# Patient Record
Sex: Female | Born: 1940 | Race: White | Hispanic: No | Marital: Married | Smoking: Former smoker
Health system: Southern US, Community
[De-identification: ages and names within clinical notes are randomized; demographics above are authoritative.]

## PROBLEM LIST (undated history)

## (undated) DIAGNOSIS — K219 Gastro-esophageal reflux disease without esophagitis: Secondary | ICD-10-CM

## (undated) DIAGNOSIS — F32A Depression, unspecified: Secondary | ICD-10-CM

## (undated) DIAGNOSIS — I2510 Atherosclerotic heart disease of native coronary artery without angina pectoris: Secondary | ICD-10-CM

## (undated) DIAGNOSIS — I509 Heart failure, unspecified: Secondary | ICD-10-CM

## (undated) DIAGNOSIS — I10 Essential (primary) hypertension: Secondary | ICD-10-CM

## (undated) DIAGNOSIS — F419 Anxiety disorder, unspecified: Secondary | ICD-10-CM

## (undated) DIAGNOSIS — J449 Chronic obstructive pulmonary disease, unspecified: Secondary | ICD-10-CM

## (undated) DIAGNOSIS — F329 Major depressive disorder, single episode, unspecified: Secondary | ICD-10-CM

## (undated) DIAGNOSIS — M1990 Unspecified osteoarthritis, unspecified site: Secondary | ICD-10-CM

## (undated) HISTORY — PX: APPENDECTOMY: SHX54

## (undated) HISTORY — PX: CHOLECYSTECTOMY: SHX55

---

## 2010-05-16 DIAGNOSIS — R079 Chest pain, unspecified: Secondary | ICD-10-CM

## 2015-09-09 ENCOUNTER — Inpatient Hospital Stay (HOSPITAL_COMMUNITY)
Admission: AD | Admit: 2015-09-09 | Discharge: 2015-09-11 | DRG: 287 | Disposition: A | Discharge status: Home / Self Care | Payer: Medicare HMO | Source: Other Acute Inpatient Hospital | Attending: Cardiovascular Disease | Admitting: Cardiovascular Disease

## 2015-09-09 ENCOUNTER — Encounter (HOSPITAL_COMMUNITY): Payer: Self-pay | Admitting: Student

## 2015-09-09 DIAGNOSIS — Z955 Presence of coronary angioplasty implant and graft: Secondary | ICD-10-CM | POA: Diagnosis not present

## 2015-09-09 DIAGNOSIS — Z8249 Family history of ischemic heart disease and other diseases of the circulatory system: Secondary | ICD-10-CM

## 2015-09-09 DIAGNOSIS — Z791 Long term (current) use of non-steroidal anti-inflammatories (NSAID): Secondary | ICD-10-CM | POA: Diagnosis not present

## 2015-09-09 DIAGNOSIS — I2510 Atherosclerotic heart disease of native coronary artery without angina pectoris: Secondary | ICD-10-CM | POA: Diagnosis present

## 2015-09-09 DIAGNOSIS — Z7951 Long term (current) use of inhaled steroids: Secondary | ICD-10-CM | POA: Diagnosis not present

## 2015-09-09 DIAGNOSIS — I25110 Atherosclerotic heart disease of native coronary artery with unstable angina pectoris: Secondary | ICD-10-CM | POA: Diagnosis not present

## 2015-09-09 DIAGNOSIS — Z8673 Personal history of transient ischemic attack (TIA), and cerebral infarction without residual deficits: Secondary | ICD-10-CM | POA: Diagnosis not present

## 2015-09-09 DIAGNOSIS — J810 Acute pulmonary edema: Secondary | ICD-10-CM

## 2015-09-09 DIAGNOSIS — R062 Wheezing: Secondary | ICD-10-CM

## 2015-09-09 DIAGNOSIS — I214 Non-ST elevation (NSTEMI) myocardial infarction: Secondary | ICD-10-CM | POA: Diagnosis not present

## 2015-09-09 DIAGNOSIS — I110 Hypertensive heart disease with heart failure: Principal | ICD-10-CM | POA: Diagnosis present

## 2015-09-09 DIAGNOSIS — J441 Chronic obstructive pulmonary disease with (acute) exacerbation: Secondary | ICD-10-CM

## 2015-09-09 DIAGNOSIS — R0902 Hypoxemia: Secondary | ICD-10-CM | POA: Diagnosis present

## 2015-09-09 DIAGNOSIS — R0789 Other chest pain: Secondary | ICD-10-CM | POA: Diagnosis present

## 2015-09-09 DIAGNOSIS — Z7902 Long term (current) use of antithrombotics/antiplatelets: Secondary | ICD-10-CM

## 2015-09-09 DIAGNOSIS — Z7982 Long term (current) use of aspirin: Secondary | ICD-10-CM | POA: Diagnosis not present

## 2015-09-09 DIAGNOSIS — I509 Heart failure, unspecified: Secondary | ICD-10-CM | POA: Diagnosis not present

## 2015-09-09 DIAGNOSIS — Z9049 Acquired absence of other specified parts of digestive tract: Secondary | ICD-10-CM | POA: Diagnosis not present

## 2015-09-09 DIAGNOSIS — I5033 Acute on chronic diastolic (congestive) heart failure: Secondary | ICD-10-CM | POA: Diagnosis present

## 2015-09-09 DIAGNOSIS — F419 Anxiety disorder, unspecified: Secondary | ICD-10-CM | POA: Diagnosis present

## 2015-09-09 DIAGNOSIS — I10 Essential (primary) hypertension: Secondary | ICD-10-CM | POA: Diagnosis not present

## 2015-09-09 DIAGNOSIS — K219 Gastro-esophageal reflux disease without esophagitis: Secondary | ICD-10-CM | POA: Diagnosis present

## 2015-09-09 DIAGNOSIS — Z87891 Personal history of nicotine dependence: Secondary | ICD-10-CM

## 2015-09-09 DIAGNOSIS — Z880 Allergy status to penicillin: Secondary | ICD-10-CM

## 2015-09-09 DIAGNOSIS — F329 Major depressive disorder, single episode, unspecified: Secondary | ICD-10-CM | POA: Diagnosis present

## 2015-09-09 DIAGNOSIS — R0600 Dyspnea, unspecified: Secondary | ICD-10-CM

## 2015-09-09 HISTORY — DX: Heart failure, unspecified: I50.9

## 2015-09-09 HISTORY — DX: Unspecified osteoarthritis, unspecified site: M19.90

## 2015-09-09 HISTORY — DX: Major depressive disorder, single episode, unspecified: F32.9

## 2015-09-09 HISTORY — DX: Anxiety disorder, unspecified: F41.9

## 2015-09-09 HISTORY — DX: Gastro-esophageal reflux disease without esophagitis: K21.9

## 2015-09-09 HISTORY — DX: Essential (primary) hypertension: I10

## 2015-09-09 HISTORY — DX: Depression, unspecified: F32.A

## 2015-09-09 HISTORY — DX: Atherosclerotic heart disease of native coronary artery without angina pectoris: I25.10

## 2015-09-09 HISTORY — DX: Chronic obstructive pulmonary disease, unspecified: J44.9

## 2015-09-09 LAB — CBC WITH DIFFERENTIAL/PLATELET
BASOS ABS: 0 10*3/uL (ref 0.0–0.1)
Basophils Relative: 0 %
EOS ABS: 0 10*3/uL (ref 0.0–0.7)
EOS PCT: 0 %
HCT: 35.7 % — ABNORMAL LOW (ref 36.0–46.0)
HEMOGLOBIN: 11.1 g/dL — AB (ref 12.0–15.0)
LYMPHS ABS: 1.3 10*3/uL (ref 0.7–4.0)
LYMPHS PCT: 6 %
MCH: 26.1 pg (ref 26.0–34.0)
MCHC: 31.1 g/dL (ref 30.0–36.0)
MCV: 84 fL (ref 78.0–100.0)
Monocytes Absolute: 0.7 10*3/uL (ref 0.1–1.0)
Monocytes Relative: 3 %
NEUTROS PCT: 91 %
Neutro Abs: 18.2 10*3/uL — ABNORMAL HIGH (ref 1.7–7.7)
PLATELETS: 270 10*3/uL (ref 150–400)
RBC: 4.25 MIL/uL (ref 3.87–5.11)
RDW: 14.6 % (ref 11.5–15.5)
WBC: 20.2 10*3/uL — AB (ref 4.0–10.5)

## 2015-09-09 LAB — PROTIME-INR
INR: 1.08
PROTHROMBIN TIME: 14 s (ref 11.4–15.2)

## 2015-09-09 LAB — TROPONIN I
Troponin I: 0.28 ng/mL (ref <0.03)
Troponin I: 0.32 ng/mL (ref <0.03)

## 2015-09-09 LAB — APTT: APTT: 88 s — AB (ref 24–36)

## 2015-09-09 LAB — BASIC METABOLIC PANEL
Anion gap: 12 (ref 5–15)
BUN: 38 mg/dL — AB (ref 6–20)
CHLORIDE: 102 mmol/L (ref 101–111)
CO2: 22 mmol/L (ref 22–32)
CREATININE: 1.14 mg/dL — AB (ref 0.44–1.00)
Calcium: 8.8 mg/dL — ABNORMAL LOW (ref 8.9–10.3)
GFR, EST AFRICAN AMERICAN: 54 mL/min — AB (ref >60)
GFR, EST NON AFRICAN AMERICAN: 46 mL/min — AB (ref >60)
Glucose, Bld: 163 mg/dL — ABNORMAL HIGH (ref 65–99)
POTASSIUM: 4.8 mmol/L (ref 3.5–5.1)
SODIUM: 136 mmol/L (ref 135–145)

## 2015-09-09 LAB — BRAIN NATRIURETIC PEPTIDE: B NATRIURETIC PEPTIDE 5: 414 pg/mL — AB (ref 0.0–100.0)

## 2015-09-09 MED ORDER — METOPROLOL TARTRATE 50 MG PO TABS
50.0000 mg | ORAL_TABLET | Freq: Two times a day (BID) | ORAL | Status: DC
  Administered 2015-09-09: 50 mg via ORAL
  Filled 2015-09-09 (×4): qty 1

## 2015-09-09 MED ORDER — ASPIRIN 81 MG PO CHEW
81.0000 mg | CHEWABLE_TABLET | Freq: Every day | ORAL | Status: DC | PRN
  Administered 2015-09-11: 81 mg via ORAL
  Filled 2015-09-09: qty 1

## 2015-09-09 MED ORDER — ONDANSETRON HCL 4 MG/2ML IJ SOLN: 4.0000 mg | Freq: Four times a day (QID) | INTRAMUSCULAR | Status: DC | PRN

## 2015-09-09 MED ORDER — SODIUM CHLORIDE 0.9% FLUSH: 3.0000 mL | INTRAVENOUS | Status: DC | PRN

## 2015-09-09 MED ORDER — HEPARIN (PORCINE) IN NACL 100-0.45 UNIT/ML-% IJ SOLN
750.0000 [IU]/h | INTRAMUSCULAR | Status: DC
  Administered 2015-09-09: 750 [IU]/h via INTRAVENOUS

## 2015-09-09 MED ORDER — LISINOPRIL 40 MG PO TABS
40.0000 mg | ORAL_TABLET | Freq: Every day | ORAL | Status: DC
  Administered 2015-09-10 – 2015-09-11 (×2): 40 mg via ORAL
  Filled 2015-09-09 (×2): qty 1

## 2015-09-09 MED ORDER — GABAPENTIN 400 MG PO CAPS
800.0000 mg | ORAL_CAPSULE | Freq: Three times a day (TID) | ORAL | Status: DC
  Administered 2015-09-09 – 2015-09-11 (×6): 800 mg via ORAL
  Filled 2015-09-09 (×7): qty 2

## 2015-09-09 MED ORDER — HEPARIN BOLUS VIA INFUSION
3000.0000 [IU] | Freq: Once | INTRAVENOUS | Status: AC
  Administered 2015-09-09: 3000 [IU] via INTRAVENOUS
  Filled 2015-09-09: qty 3000

## 2015-09-09 MED ORDER — FUROSEMIDE 10 MG/ML IJ SOLN: 40.0000 mg | Freq: Two times a day (BID) | INTRAMUSCULAR | Status: DC

## 2015-09-09 MED ORDER — SODIUM CHLORIDE 0.9% FLUSH
3.0000 mL | Freq: Two times a day (BID) | INTRAVENOUS | Status: DC
  Administered 2015-09-09 – 2015-09-11 (×5): 3 mL via INTRAVENOUS

## 2015-09-09 MED ORDER — PANTOPRAZOLE SODIUM 40 MG PO TBEC
40.0000 mg | DELAYED_RELEASE_TABLET | Freq: Every day | ORAL | Status: DC
  Administered 2015-09-10 – 2015-09-11 (×2): 40 mg via ORAL
  Filled 2015-09-09 (×2): qty 1

## 2015-09-09 MED ORDER — ENOXAPARIN SODIUM 40 MG/0.4ML ~~LOC~~ SOLN: 40.0000 mg | SUBCUTANEOUS | Status: DC

## 2015-09-09 MED ORDER — CLOPIDOGREL BISULFATE 75 MG PO TABS
75.0000 mg | ORAL_TABLET | Freq: Every day | ORAL | Status: DC
  Administered 2015-09-09 – 2015-09-11 (×3): 75 mg via ORAL
  Filled 2015-09-09 (×4): qty 1

## 2015-09-09 MED ORDER — NITROGLYCERIN 0.4 MG SL SUBL
0.4000 mg | SUBLINGUAL_TABLET | SUBLINGUAL | Status: DC | PRN
  Administered 2015-09-09 – 2015-09-10 (×3): 0.4 mg via SUBLINGUAL
  Filled 2015-09-09 (×2): qty 1

## 2015-09-09 MED ORDER — ACETAMINOPHEN 325 MG PO TABS: 650.0000 mg | ORAL_TABLET | ORAL | Status: DC | PRN

## 2015-09-09 MED ORDER — MIRTAZAPINE 15 MG PO TABS
15.0000 mg | ORAL_TABLET | Freq: Every day | ORAL | Status: DC
  Administered 2015-09-09 – 2015-09-10 (×2): 15 mg via ORAL
  Filled 2015-09-09 (×2): qty 1

## 2015-09-09 MED ORDER — DOXYCYCLINE HYCLATE 100 MG PO TABS
100.0000 mg | ORAL_TABLET | Freq: Two times a day (BID) | ORAL | Status: AC
  Administered 2015-09-09 – 2015-09-10 (×3): 100 mg via ORAL
  Filled 2015-09-09 (×3): qty 1

## 2015-09-09 MED ORDER — SODIUM CHLORIDE 0.9 % IV SOLN: 250.0000 mL | INTRAVENOUS | Status: DC | PRN

## 2015-09-09 MED ORDER — PREDNISONE 20 MG PO TABS
40.0000 mg | ORAL_TABLET | Freq: Every day | ORAL | Status: AC
  Filled 2015-09-09: qty 2

## 2015-09-09 MED ORDER — NITROGLYCERIN 0.4 MG SL SUBL
SUBLINGUAL_TABLET | SUBLINGUAL | Status: AC
  Filled 2015-09-09: qty 1

## 2015-09-09 MED ORDER — CLONIDINE HCL 0.2 MG PO TABS
0.3000 mg | ORAL_TABLET | Freq: Three times a day (TID) | ORAL | Status: DC
  Administered 2015-09-09 – 2015-09-10 (×3): 0.3 mg via ORAL
  Filled 2015-09-09 (×6): qty 1

## 2015-09-09 MED ORDER — SERTRALINE HCL 100 MG PO TABS
100.0000 mg | ORAL_TABLET | Freq: Every day | ORAL | Status: DC
  Administered 2015-09-10 – 2015-09-11 (×2): 100 mg via ORAL
  Filled 2015-09-09 (×2): qty 1

## 2015-09-09 MED ORDER — ALBUTEROL SULFATE (2.5 MG/3ML) 0.083% IN NEBU
2.5000 mg | INHALATION_SOLUTION | RESPIRATORY_TRACT | Status: DC | PRN
Start: 2015-09-09 — End: 2015-09-11

## 2015-09-09 MED ORDER — FUROSEMIDE 10 MG/ML IJ SOLN
40.0000 mg | Freq: Two times a day (BID) | INTRAMUSCULAR | Status: DC
  Administered 2015-09-09 – 2015-09-11 (×4): 40 mg via INTRAVENOUS
  Filled 2015-09-09 (×5): qty 4

## 2015-09-09 MED ORDER — IPRATROPIUM-ALBUTEROL 0.5-2.5 (3) MG/3ML IN SOLN
3.0000 mL | Freq: Four times a day (QID) | RESPIRATORY_TRACT | Status: DC
  Administered 2015-09-09 – 2015-09-10 (×4): 3 mL via RESPIRATORY_TRACT
  Filled 2015-09-09 (×6): qty 3

## 2015-09-09 NOTE — Progress Notes (Signed)
ANTICOAGULATION CONSULT NOTE - Initial Consult  Pharmacy Consult for Heparin Indication: chest pain/ACS  Allergies  Allergen Reactions  . Penicillins Hives and Swelling    Patient Measurements: Height: 5\' 1"  (154.9 cm) Weight: 156 lb 12 oz (71.1 kg) IBW/kg (Calculated) : 47.8 Heparin Dosing Weight: 63.2kg  Vital Signs: Temp: 97.9 F (36.6 C) (09/07 1317) Temp Source: Oral (09/07 1317) BP: 152/63 (09/07 1655) Pulse Rate: 66 (09/07 1655)  Labs:  Recent Labs  09/09/15 1558  HGB 11.1*  HCT 35.7*  PLT 270  CREATININE 1.14*  TROPONINI 0.28*    Estimated Creatinine Clearance: 39 mL/min (by C-G formula based on SCr of 1.14 mg/dL).   Medical History: Past Medical History:  Diagnosis Date  . Anxiety   . Arthritis   . CAD (coronary artery disease)    a. s/p DES in 2005  . CHF (congestive heart failure) (HCC)   . COPD (chronic obstructive pulmonary disease) (HCC)   . Depression   . GERD (gastroesophageal reflux disease)   . HTN (hypertension)     Medications:  See EMR  Assessment: [age] YOF with COPD exacerbation transferring [facility] for further cardiac work-up. To start heparin for r/o ACS. No PTA anti-coag. Plan for cath once COPD exacerbation and volume overload better managed. Baseline aPTT/INR pending. Hgb 11.1, plt 270.  Goal of Therapy:  Heparin level 0.3-0.7 units/ml Monitor platelets by anticoagulation protocol: Yes   Plan:  Heparin 3000 unit bolus followed by 750 units/hr 8hr HL Monitor CBC, s/sx bleeding   Sherle Poeob Vincent, PharmD Clinical Pharmacist 6:20 PM, 09/09/2015

## 2015-09-09 NOTE — Consult Note (Signed)
Name: Patricia Hardin MRN: [id number] DOB: [birth year]    ADMISSION DATE:  09/09/2015 CONSULTATION DATE:  9/7  REFERRING MD :  Dr. Clifton James  CHIEF COMPLAINT:  Dyspnea  HISTORY OF PRESENT ILLNESS:  75 year old female with PMH as below, which is significant for COPD (on scheduled albuterol only), CAD (s/p DES to RCA in 2005), HTN, and CVA. She was initially admitted to [facility] 9/3 for chest pain which has occurred daily for several years. Also presented with fevers, chills, and dyspnea. Chest pain was atypical in presentation and she was treated as COPD exacerbation with scheduled duonebs, steroids, and doxycycline. She was also hypertensive on presentation with SBP 250s treated with labetalol. BNP elevated on admission which proved to be even worse 7/6 and she was given 20mg  lasix. Stress test on 9/5 demonstrated ST changes so she was transferred to [facility] for potential left heart catheterization. Upon arrival to [facility] she remains on 2L O2, PCCM consulted for evaluation of dyspnea.   At baseline she is markedly limited by dyspnea at home. She states she can barely walk across a small room without feeling short of breath. She attributes this to lung disease. She has a chronic cough and occasionally bring up greyish sputum. This frequency has not increased recently.  SIGNIFICANT EVENTS  9/3 admit for CP, dyspnea to [facility] 9/7 Transfer to [facility] for potential LHC.   STUDIES:  9/5 stress test 2-3 mm ST depression on  9/6 echo with global LV hypokinesis  PAST MEDICAL HISTORY :   has a past medical history of Anxiety; Arthritis; CAD (coronary artery disease); CHF (congestive heart failure) (HCC); COPD (chronic obstructive pulmonary disease) (HCC); Depression; GERD (gastroesophageal reflux disease); and HTN (hypertension).  has a past surgical history that includes Appendectomy and Cholecystectomy. Prior to Admission medications   Medication Sig Start Date End Date Taking?  Authorizing Provider  albuterol (PROVENTIL HFA;VENTOLIN HFA) 108 (90 Base) MCG/ACT inhaler Inhale 1 puff into the lungs every 4 (four) hours as needed for wheezing or shortness of breath.   Yes Historical Provider, MD  aspirin 81 MG chewable tablet Chew 81 mg by mouth daily as needed for mild pain.   Yes Historical Provider, MD  cloNIDine (CATAPRES) 0.3 MG tablet Take 0.3 mg by mouth 3 (three) times daily.   Yes Historical Provider, MD  clopidogrel (PLAVIX) 75 MG tablet Take 75 mg by mouth daily. 06/16/15  Yes Historical Provider, MD  diltiazem (CARDIZEM) 90 MG tablet Take 90 mg by mouth daily.   Yes Historical Provider, MD  furosemide (LASIX) 40 MG tablet Take 40 mg by mouth daily.   Yes Historical Provider, MD  gabapentin (NEURONTIN) 800 MG tablet Take 800 mg by mouth 3 (three) times daily. 07/22/15  Yes Historical Provider, MD  Ibuprofen-Diphenhydramine Cit (IBUPROFEN PM) 200-38 MG TABS Take 1 tablet by mouth at bedtime.   Yes Historical Provider, MD  lisinopril (PRINIVIL,ZESTRIL) 40 MG tablet Take 40 mg by mouth daily.   Yes Historical Provider, MD  metoprolol (LOPRESSOR) 100 MG tablet Take 100 mg by mouth 2 (two) times daily. 07/22/15  Yes Historical Provider, MD  mirtazapine (REMERON) 15 MG tablet Take 15 mg by mouth at bedtime. 07/22/15  Yes Historical Provider, MD  omeprazole (PRILOSEC) 20 MG capsule Take 20 mg by mouth daily. 07/22/15  Yes Historical Provider, MD  sertraline (ZOLOFT) 100 MG tablet Take 100 mg by mouth daily. 06/16/15  Yes Historical Provider, MD   Allergies  Allergen Reactions  .  Penicillins Hives and Swelling    FAMILY HISTORY:  family history includes Hypertension in her mother. SOCIAL HISTORY:  reports that she has quit smoking. She has a 40.00 pack-year smoking history. She has never used smokeless tobacco. She reports that she does not drink alcohol or use drugs.  REVIEW OF SYSTEMS:   Bolds are positive  Constitutional: weight loss, gain, night sweats, Fevers,  chills, fatigue .  HEENT: headaches, Sore throat, sneezing, nasal congestion, post nasal drip, Difficulty swallowing, Tooth/dental problems, visual complaints visual changes, ear ache CV:  chest pain, radiates: ,Orthopnea, PND, swelling in lower extremities, dizziness, palpitations, syncope.  GI  heartburn, indigestion, abdominal pain, nausea, vomiting, diarrhea, change in bowel habits, loss of appetite, bloody stools.  Resp: cough, productive: , hemoptysis, dyspnea on exhertion, chest pain, pleuritic.  Skin: rash or itching or icterus GU: dysuria, change in color of urine, urgency or frequency. flank pain, hematuria  MS: joint pain or swelling. decreased range of motion  Psych: change in mood or affect. depression or anxiety.  Neuro: difficulty with speech, weakness, numbness, ataxia    SUBJECTIVE:   VITAL SIGNS: Temp:  [97.9 F (36.6 C)] 97.9 F (36.6 C) (09/07 1317) Pulse Rate:  [69] 69 (09/07 1317) Resp:  [20] 20 (09/07 1317) BP: (139)/(58) 139/58 (09/07 1317) SpO2:  [93 %] 93 % (09/07 1317)  PHYSICAL EXAMINATION: General:  Chronically ill appearing female in NAD Neuro:  Alert, oriented, non-focal HEENT: [place]/AT, PERRL Cardiovascular:  RRR, no MRG, trace pre-tibial edema.  Lungs:  Posterior rhonchi, upper airway wheeze that resolves with pursed lip breathing. Abdomen:  Soft, non-tender, non-distended Musculoskeletal:  No acute deformity or ROM limitation Skin:  Grossly intact  No results for input(s): NA, K, CL, CO2, BUN, CREATININE, GLUCOSE in the last 168 hours. No results for input(s): HGB, HCT, WBC, PLT in the last 168 hours. No results found.  ASSESSMENT / PLAN:  Dyspnea/Hypoxia:  Patricia Hardin has known diagnosis of COPD for which she only takes albuterol scheduled TID. Symptoms on presentation sound infectious in nature and she has been on antibiotics (doxy) since that time for suspected COPD exacerbation. I suspect that if she was exacerbating her COPD, this is now much  improved, if not resolved. Hard to determine how far off of her baseline current hypoxia truly is. Suspect congestive heart failure playing at least somewhat of a role based on [facility].   Plan: Await CXR Continue prednisone at 40mg  for 5 days total and discontinue Continue doxycycline for total 5 days Scheduled duonebs, which she will likely need on discharge PRN albuterol nebulized Diuresis per cardiology She will need to follow up with pulmonology as an outpatient for PFTs and optimization of her COPD regimen.   Joneen RoachPaul Hoffman, AGACNP-BC [facility] Pager [phone] or [phone]  09/09/2015 4:50 PM  Attending Note:  75 year old female with history of COPD, 45 pack year smoker, quit 15 years ago who is not O2 dependent at home but uses albuterol inhaler 3 times per day as instructed.  She is presenting with no increase in sputum production, no fever/chills, grey sputum with no green.  On exam, wheezes disappear with pursed lip breathing.  I reviewed myself from [facility] with hyperinflation but no distinct consolidation.  Discussed with PCCM-NP.  Patient does not have an acute exacerbation of COPD by gold criteria.  VCD is more likely and hypoxemia is more CHF.  COPD:                       -  Duonebs.                       - Steroids with stop date after 5 days.                       - Doxy with stop date after 5 days.                       - PRN albuterol.                       - Need PFT and f/u with PCCM upon discharge  Hypoxemia:                       - Titrate O2 for sat of 88-92%.                       - Will need an ambulatory desaturation study prior to discharge as I anticipate will need home O2.  Pulmonary edema:                       - Lasix.                       - Treatment of CHF per cardiology.  Wheezing: VCD related rather than true wheezing.                       - Taught patient pursed lip breathing.                        - May benefit from cardiopulmonary rehab upon discharge to teach patient how to breath properly to improve her quality of life.  Patient seen and examined, agree with above note.  I dictated the care and orders written for this patient under my direction.  Alyson Reedy, MD [phone]

## 2015-09-09 NOTE — Progress Notes (Signed)
Pt arrived to [facility] via [facility] from [facility]. Upon arrival transport states her IV blew and they stuck her x2 with no avail. Pt currently stating back and chest pain 10/10. EKG completed. Nitro x1 given. MD paged.

## 2015-09-09 NOTE — H&P (Signed)
History & Physical    Patient ID: Patricia Hardin MRN: [id number], DOB/AGE: [birth year]   Admit date: 09/09/2015  Primary Physician: No primary care provider on file. Primary Cardiologist: Dr. Margret [place]ter [place]  History of Present Illness    Patricia DikeHelen Ruth Llerenas is a 75 y.o. female with past medical history of CAD (s/p DES to RCA in 2005), COPD, HTN, and CVA who presented to [facility] on 09/05/2015 for evaluation of chest pain and was transferred to [facility] on 09/09/2015 for further evaluation.  In talking with the patient, she reports having chest pain on an almost daily basis for years. On the day of her initial presentation she reported having fevers, chills, worsening dyspnea, and pain along her left pectoral region.   She reports having chronic chest pain for many years. Says it can occur with rest or exertion and lasts up to 30 minutes prior to resolving. She had been given SL NTG [facility] with no improvement in her symptoms. Her chest is very tender to palpation along her entire precordium.   In reviewing records from [facility], her BP upon arrival on 09/05/2015 was 253/122 and she was started on Labetaolol. Initial labs showed a negative d-dimer, negative troponin, BNP 1600, and WBC of [id number]. CXR showed no active cardiopulmonary disease.   Repeat labs on 9/7 show a WBC of 19.6, Hgb 10.1, and platelets 294. A repeat troponin was 0.03 and BNP was 4656. It appears she was given one dose of Solumedrol on admission and started on Prednisone 40mg  daily. Received 1 dose of IV Lasix 20mg  on 9/6. Has been receiving scheduled Duonebs Q4H and Doxycycline 100mg  BID.   A stress dobutamine was obtained on 09/07/2015 shows positive EKG changes with 2-603mm horizontal ST depression in the inferolateral leads. Echocardiographic results showed evidence of mild global hypokinesis of the left ventricle. Cardiac catheterization was recommended for definitive evaluation.   Past Medical  History    Past Medical History:  Diagnosis Date  . CAD (coronary artery disease)    a. s/p DES in 2005  . COPD (chronic obstructive pulmonary disease) (HCC)   . HTN (hypertension)     Past Surgical History:  Procedure Laterality Date  . APPENDECTOMY    . CHOLECYSTECTOMY       Allergies  Allergies  Allergen Reactions  . Penicillins Hives and Swelling     Home Medications    Prior to Admission medications   Not on File    Family History    Family History  Problem Relation Age of Onset  . Hypertension Mother     Social History    Social History   Social History  . Marital status: Married    Spouse name: N/A  . Number of children: N/A  . Years of education: N/A   Occupational History  . Not on file.   Social History Main Topics  . Smoking status: Former Smoker    Packs/day: 1.00    Years: 40.00  . Smokeless tobacco: Not on file  . Alcohol use No  . Drug use: No  . Sexual activity: Not on file   Other Topics Concern  . Not on file   Social History Narrative  . No narrative on file     Review of Systems    General:  No night sweats or weight changes. Positive for fevers, chills, and generalized body aches. Cardiovascular:  No edema, orthopnea, palpitations, paroxysmal nocturnal dyspnea. Positive for chest pain and  dyspnea with exertion.  Dermatological: No rash, lesions/masses Respiratory: Positive for cough and dyspnea. Urologic: No hematuria, dysuria Abdominal:   No nausea, vomiting, diarrhea, bright red blood per rectum, melena, or hematemesis Neurologic:  No visual changes, wkns, changes in mental status. All other systems reviewed and are otherwise negative except as noted above.  Physical Exam    Blood pressure (!) 139/58, pulse 69, temperature 97.9 F (36.6 C), temperature source Oral, resp. rate 20, SpO2 93 %.  General: Pleasant elderly Caucasian female appearing in no acute distress. Head: Normocephalic, atraumatic, sclera  non-icteric, no xanthomas, nares are without discharge. Dentition:  Neck: No carotid bruits. JVD at 9cm.  Lungs: Respirations regular and unlabored, rhonchi throughout lungs fields, expiratory wheezing noted. Heart: Regular rate and rhythm. No S3 or S4.  No murmur, no rubs, or gallops appreciated. Abdomen: Soft, non-tender, non-distended with normoactive bowel sounds. No hepatomegaly. No rebound/guarding. No obvious abdominal masses. Msk:  Strength and tone appear normal for age. No joint deformities or effusions. Extremities: No clubbing or cyanosis. No edema.  Distal pedal pulses are 2+ bilaterally. Neuro: Alert and oriented X 3. Moves all extremities spontaneously. No focal deficits noted. Psych:  Responds to questions appropriately with a normal affect. Skin: No rashes or lesions noted  Labs    9/3 (Obtained from OSH): Negative d-dimer, negative troponin, BNP 1600, and WBC of 16109. CXR showed no active cardiopulmonary disease.   9/7 (Obtained from OSH): WBC of 19.6, Hgb 10.1, and platelets 294. A repeat troponin was 0.03 and BNP was 4656.   Radiology Studies    None on File.   EKG & Cardiac Imaging    EKG: NSR, HR 68 with no acute ST or T-wave changes.  STRESS ECHOCARDIOGRAM: 09/07/2015 (Obtained from OSH) Positive EKG changes with 2-39mm horizontal ST depression in the inferolateral leads. Echocardiographic results showed evidence of mild global hypokinesis of the left ventricle. Cardiac catheterization was recommended for definitive evaluation.    Assessment & Plan    1. Worsening Dyspnea/Acute on Chronic Diastolic CHF - presented initially with fever, chills, dyspnea, and worsening chest discomfort. CXR negative. Had been started on Abx and steroids at [facility] for likely COPD exacerbation. Will continue current medications for now and consult Pulmonology for further recommendations.  - BNP was 1600 on 9/3, up to 4656 on 9/7. Will recheck BNP and CXR.  - will start on  IV Lasix 40mg  daily. Monitor I&O's with daily weights. May require Foley as she reports issues with incontinence.  - will obtain echocardiogram to assess LV function and wall motion.    2. COPD Exacerbation - has known COPD, not on home O2 but currently on 2L [place]. - will continue previously started Doxycycline 100mg  BID and Prednisone 40mg  daily. Scheduled Duonebs. Will obtain repeat CXR. WBC elevated to [address], possibly secondary to steroid use. - will consult Pulmonology for assistance. Appreciate recommendations  3. Atypical Chest Pain - patient reports having chest discomfort for the past several years, lasting for up to 30 minutes. No relief with SL NTG. - stress dobutamine on 09/07/2015 from OSH showed positive EKG changes with 2-59mm horizontal ST depression in the inferolateral leads. Echocardiographic results showed evidence of mild global hypokinesis of the left ventricle. Cardiac catheterization was recommended for definitive evaluation. - at this time, her respiratory status is not optical for a cardiac catheterization. Her symptoms appear very atypical as it is reproducible on palpation. - troponin negative at OSH. Will cycle values here. If they becomes positive, start  Heparin.    4. CAD - s/p DES to RCA in 2005. - continue ASA, Plavix and BB. Will check Lipid Panel for risk stratification as she is not currently on statin therapy.  5. HTN - SBP currently in the 130's. Was hypertensive up to 253/122 on arrival to [facility]. By review of records it is unclear exactly what she was taking on a daily basis while there. - will continue Lisinopril 40mg  daily, Lopressor 50mg  BID (was on 100mg  BID- may need to titrate up if BP becomes elevated. Will do lower dose for now while receiving IV diuresis), and Clonidine 0.3mg  TID.    Signed, Ellsworth Lennox, PA-C 09/09/2015, 2:18 PM Pager: [phone]  I have personally seen and examined this patient with Randall An, PA-C. I  agree with the assessment and plan as outlined above. She was admitted to [facility] 4 days ago with fever, chills, dyspnea and was diagnosed with COPD exacerbation. She has chest wall pain that is reproducible to touch. Because of this troponin was checked and it was subtly elevated at 0.04. Dobutamine stress echo with ischemic EKG changes and possible ischemia with worsened global wall motion after dobutamine infusion. She is known to have CAD with prior PCI/stent in 2005. She has been treated for COPD exacerbation with steroids, nebs and antibiotics with no clear clinical improvement. She has been given one dose of IV Lasix yesterday for volume overload but is still volume overloaded. Exam shows a well developed female in NAD. Neck: +JVD CV:RRR without murmur. Lungs: diffuse rhonci with expiratory wheezing. Abd: soft, NT Ext: No edema. Labs reviewed.  Will continue treatment for COPD exacerbation and volume overload before considering cardiac cath. Her chest pain is reproducible and her troponin shows no significant elevation. I suspect her symptoms are primarily driven by her acute pulmonary process. Given the abnormal stress echo, she will need cardiac cath before discharge. We will obtain an echo, chest x-ray, labs. Will ask pulmonary team to assist with management of COPD. Will begin diuresis for volume overload.   Verne Carrow 09/09/2015 3:39 PM

## 2015-09-09 NOTE — Progress Notes (Deleted)
Attending Note:  75 year old female with history of COPD, 45 pack year smoker, quit 15 years ago who is not O2 dependent at home but uses albuterol inhaler 3 times per day as instructed.  She is presenting with no increase in sputum production, no fever/chills, grey sputum with no green.  On exam, wheezes disappear with pursed lip breathing.  I reviewed myself from [facility] with hyperinflation but no distinct consolidation.  Discussed with PCCM-NP.  Patient does not have an acute exacerbation of COPD by gold criteria.  VCD is more likely and hypoxemia is more CHF.  COPD:  - Duonebs.  - Steroids with stop date after 5 days.  - Doxy with stop date after 5 days.  - PRN albuterol.  - Need PFT and f/u with PCCM upon discharge  Hypoxemia:  - Titrate O2 for sat of 88-92%.  - Will need an ambulatory desaturation study prior to discharge as I anticipate will need home O2.  Pulmonary edema:  - Lasix.  - Treatment of CHF per cardiology.  Wheezing: VCD related rather than true wheezing.  - Taught patient pursed lip breathing.  - May benefit from cardiopulmonary rehab upon discharge to teach patient how to breath properly to improve her quality of life.  Patient seen and examined, agree with above note.  I dictated the care and orders written for this patient under my direction.  Alyson ReedyWesam G Yacoub, MD [phone]

## 2015-09-10 ENCOUNTER — Inpatient Hospital Stay (HOSPITAL_COMMUNITY): Payer: Medicare HMO

## 2015-09-10 ENCOUNTER — Other Ambulatory Visit (HOSPITAL_COMMUNITY): Payer: Medicare Other

## 2015-09-10 ENCOUNTER — Encounter (HOSPITAL_COMMUNITY)
Admission: AD | Disposition: A | Discharge status: Home / Self Care | Payer: Self-pay | Source: Other Acute Inpatient Hospital | Attending: Cardiovascular Disease

## 2015-09-10 DIAGNOSIS — I2510 Atherosclerotic heart disease of native coronary artery without angina pectoris: Secondary | ICD-10-CM

## 2015-09-10 DIAGNOSIS — I214 Non-ST elevation (NSTEMI) myocardial infarction: Secondary | ICD-10-CM

## 2015-09-10 DIAGNOSIS — I10 Essential (primary) hypertension: Secondary | ICD-10-CM

## 2015-09-10 DIAGNOSIS — R0600 Dyspnea, unspecified: Secondary | ICD-10-CM

## 2015-09-10 DIAGNOSIS — I509 Heart failure, unspecified: Secondary | ICD-10-CM

## 2015-09-10 DIAGNOSIS — I25110 Atherosclerotic heart disease of native coronary artery with unstable angina pectoris: Secondary | ICD-10-CM

## 2015-09-10 HISTORY — PX: CARDIAC CATHETERIZATION: SHX172

## 2015-09-10 LAB — CBC
HCT: 34.1 % — ABNORMAL LOW (ref 36.0–46.0)
Hemoglobin: 10.2 g/dL — ABNORMAL LOW (ref 12.0–15.0)
MCH: 25.2 pg — ABNORMAL LOW (ref 26.0–34.0)
MCHC: 29.9 g/dL — AB (ref 30.0–36.0)
MCV: 84.2 fL (ref 78.0–100.0)
PLATELETS: 265 10*3/uL (ref 150–400)
RBC: 4.05 MIL/uL (ref 3.87–5.11)
RDW: 14.5 % (ref 11.5–15.5)
WBC: 18 10*3/uL — ABNORMAL HIGH (ref 4.0–10.5)

## 2015-09-10 LAB — LIPID PANEL
CHOL/HDL RATIO: 2.8 ratio
CHOLESTEROL: 176 mg/dL (ref 0–200)
HDL: 62 mg/dL (ref >40)
LDL Cholesterol: 77 mg/dL (ref 0–99)
TRIGLYCERIDES: 187 mg/dL — AB (ref <150)
VLDL: 37 mg/dL (ref 0–40)

## 2015-09-10 LAB — HEPARIN LEVEL (UNFRACTIONATED)
HEPARIN UNFRACTIONATED: 0.34 [IU]/mL (ref 0.30–0.70)
HEPARIN UNFRACTIONATED: 0.29 [IU]/mL — AB (ref 0.30–0.70)

## 2015-09-10 LAB — BASIC METABOLIC PANEL
ANION GAP: 10 (ref 5–15)
BUN: 36 mg/dL — AB (ref 6–20)
CHLORIDE: 99 mmol/L — AB (ref 101–111)
CO2: 28 mmol/L (ref 22–32)
Calcium: 8.2 mg/dL — ABNORMAL LOW (ref 8.9–10.3)
Creatinine, Ser: 1.05 mg/dL — ABNORMAL HIGH (ref 0.44–1.00)
GFR calc Af Amer: 59 mL/min — ABNORMAL LOW (ref >60)
GFR, EST NON AFRICAN AMERICAN: 51 mL/min — AB (ref >60)
Glucose, Bld: 129 mg/dL — ABNORMAL HIGH (ref 65–99)
POTASSIUM: 3.8 mmol/L (ref 3.5–5.1)
SODIUM: 137 mmol/L (ref 135–145)

## 2015-09-10 LAB — TROPONIN I: TROPONIN I: 0.32 ng/mL — AB (ref <0.03)

## 2015-09-10 SURGERY — LEFT HEART CATH AND CORONARY ANGIOGRAPHY
Anesthesia: LOCAL

## 2015-09-10 MED ORDER — ASPIRIN 81 MG PO CHEW
81.0000 mg | CHEWABLE_TABLET | ORAL | Status: AC
  Administered 2015-09-10: 81 mg via ORAL
  Filled 2015-09-10: qty 1

## 2015-09-10 MED ORDER — HEPARIN (PORCINE) IN NACL 2-0.9 UNIT/ML-% IJ SOLN
INTRAMUSCULAR | Status: DC | PRN
  Administered 2015-09-10: 1000 mL

## 2015-09-10 MED ORDER — IOPAMIDOL (ISOVUE-370) INJECTION 76%
INTRAVENOUS | Status: DC | PRN
Start: 2015-09-10 — End: 2015-09-10
  Administered 2015-09-10: 75 mL via INTRA_ARTERIAL

## 2015-09-10 MED ORDER — SODIUM CHLORIDE 0.9% FLUSH: 3.0000 mL | INTRAVENOUS | Status: DC | PRN

## 2015-09-10 MED ORDER — SODIUM CHLORIDE 0.9 % WEIGHT BASED INFUSION
1.0000 mL/kg/h | INTRAVENOUS | Status: AC
  Administered 2015-09-10: 1 mL/kg/h via INTRAVENOUS

## 2015-09-10 MED ORDER — LIDOCAINE HCL (PF) 1 % IJ SOLN
INTRAMUSCULAR | Status: DC | PRN
  Administered 2015-09-10: 2 mL

## 2015-09-10 MED ORDER — SODIUM CHLORIDE 0.9 % IV SOLN
INTRAVENOUS | Status: DC
  Administered 2015-09-10: 10:00:00 via INTRAVENOUS

## 2015-09-10 MED ORDER — HEPARIN SODIUM (PORCINE) 5000 UNIT/ML IJ SOLN
5000.0000 [IU] | Freq: Three times a day (TID) | INTRAMUSCULAR | Status: DC
Start: 2015-09-11 — End: 2015-09-11
  Administered 2015-09-11: 5000 [IU] via SUBCUTANEOUS
  Filled 2015-09-10: qty 1

## 2015-09-10 MED ORDER — FENTANYL CITRATE (PF) 100 MCG/2ML IJ SOLN
INTRAMUSCULAR | Status: AC
  Filled 2015-09-10: qty 2

## 2015-09-10 MED ORDER — HEPARIN (PORCINE) IN NACL 2-0.9 UNIT/ML-% IJ SOLN
INTRAMUSCULAR | Status: AC
  Filled 2015-09-10: qty 1000

## 2015-09-10 MED ORDER — SODIUM CHLORIDE 0.9 % IV SOLN: 250.0000 mL | INTRAVENOUS | Status: DC | PRN

## 2015-09-10 MED ORDER — VERAPAMIL HCL 2.5 MG/ML IV SOLN
INTRAVENOUS | Status: AC
  Filled 2015-09-10: qty 2

## 2015-09-10 MED ORDER — FENTANYL CITRATE (PF) 100 MCG/2ML IJ SOLN
INTRAMUSCULAR | Status: DC | PRN
  Administered 2015-09-10: 25 ug via INTRAVENOUS

## 2015-09-10 MED ORDER — HEPARIN SODIUM (PORCINE) 1000 UNIT/ML IJ SOLN
INTRAMUSCULAR | Status: AC
  Filled 2015-09-10: qty 1

## 2015-09-10 MED ORDER — MIDAZOLAM HCL 2 MG/2ML IJ SOLN
INTRAMUSCULAR | Status: DC | PRN
  Administered 2015-09-10: 1 mg via INTRAVENOUS

## 2015-09-10 MED ORDER — IOPAMIDOL (ISOVUE-370) INJECTION 76%
INTRAVENOUS | Status: AC
  Filled 2015-09-10: qty 100

## 2015-09-10 MED ORDER — HEPARIN SODIUM (PORCINE) 1000 UNIT/ML IJ SOLN
INTRAMUSCULAR | Status: DC | PRN
  Administered 2015-09-10: 3500 [IU] via INTRAVENOUS

## 2015-09-10 MED ORDER — SODIUM CHLORIDE 0.9% FLUSH: 3.0000 mL | Freq: Two times a day (BID) | INTRAVENOUS | Status: DC

## 2015-09-10 MED ORDER — SODIUM CHLORIDE 0.9% FLUSH
3.0000 mL | Freq: Two times a day (BID) | INTRAVENOUS | Status: DC
  Administered 2015-09-11: 3 mL via INTRAVENOUS

## 2015-09-10 MED ORDER — VERAPAMIL HCL 2.5 MG/ML IV SOLN
INTRAVENOUS | Status: DC | PRN
  Administered 2015-09-10: 10 mL via INTRA_ARTERIAL

## 2015-09-10 MED ORDER — MIDAZOLAM HCL 2 MG/2ML IJ SOLN
INTRAMUSCULAR | Status: AC
  Filled 2015-09-10: qty 2

## 2015-09-10 MED ORDER — LIDOCAINE HCL (PF) 1 % IJ SOLN
INTRAMUSCULAR | Status: AC
  Filled 2015-09-10: qty 30

## 2015-09-10 SURGICAL SUPPLY — 10 items
CATH INFINITI 5FR MULTPACK ANG (CATHETERS) ×2 IMPLANT
DEVICE RAD COMP TR BAND LRG (VASCULAR PRODUCTS) ×2 IMPLANT
GLIDESHEATH SLEND SS 6F .021 (SHEATH) ×2 IMPLANT
KIT HEART LEFT (KITS) ×2 IMPLANT
PACK CARDIAC CATHETERIZATION (CUSTOM PROCEDURE TRAY) ×2 IMPLANT
SYR MEDRAD MARK V 150ML (SYRINGE) ×2 IMPLANT
TRANSDUCER W/STOPCOCK (MISCELLANEOUS) ×2 IMPLANT
TUBING CIL FLEX 10 FLL-RA (TUBING) ×2 IMPLANT
WIRE EMERALD 3MM-J .035X260CM (WIRE) ×2 IMPLANT
WIRE HI TORQ VERSACORE-J 145CM (WIRE) ×2 IMPLANT

## 2015-09-10 NOTE — Progress Notes (Signed)
Pt returned from cath lab. TR band to left wrist with 13cc of air. Left wrist site level 0. Vitals stable and being monitored per protocol. Pt resting in bed with left wrist elevated on pillow. No complaints at this time. Will continue to monitor.   Berdine DanceLauren Moffitt BSN, RN

## 2015-09-10 NOTE — Care Management Note (Signed)
Case Management Note Donn PieriniKristi Yarden Hillis RN, BSN Unit 2W-Case Manager [phone]  Patient Details  Name: Verlin DikeHelen Ruth Colon MRN: [id number] Date of Birth: May 26, 1940  Subjective/Objective:  Pt admitted with atypical chest pain- [facility]- stress dobutamine was obtained on 09/07/2015 shows positive EKG changes -plan for cardiac cath                Action/Plan: PTA pt lived at home with spouse- anticipate return home- CM to follow for d/c needs  Expected Discharge Date:                  Expected Discharge Plan:  Home/Self Care  In-House Referral:     Discharge planning Services  CM Consult  Post Acute Care Choice:    Choice offered to:     DME Arranged:    DME Agency:     HH Arranged:    HH Agency:     Status of Service:  In process, will continue to follow  If discussed at Long Length of Stay Meetings, dates discussed:    Additional Comments:  Darrold SpanWebster, Katara Griner Hall, RN 09/10/2015, 10:09 AM

## 2015-09-10 NOTE — Progress Notes (Signed)
Name: Patricia DikeHelen Ruth Hardin MRN: [id number] DOB: [birth year]    ADMISSION DATE:  09/09/2015 CONSULTATION DATE:  9/7  REFERRING MD :  Dr. Clifton JamesMcAlhany  CHIEF COMPLAINT:  Dyspnea  HISTORY OF PRESENT ILLNESS:  75 year old female with PMH as below, which is significant for COPD (on scheduled albuterol only), CAD (s/p DES to RCA in 2005), HTN, and CVA. She was initially admitted [facility] 9/3 for chest pain which has occurred daily for several years. Also presented with fevers, chills, and dyspnea. Chest pain was atypical in presentation and she was treated as COPD exacerbation with scheduled duonebs, steroids, and doxycycline. She was also hypertensive on presentation with SBP 250s treated with labetalol. BNP elevated on admission which proved to be even worse 7/6 and she was given 20mg  lasix. Stress test on 9/5 demonstrated ST changes so she was transferred [facility] for potential left heart catheterization. [facility] she remains on 2L O2, PCCM consulted for evaluation of dyspnea.   At baseline she is markedly limited by dyspnea at home. She states she can barely walk across a small room without feeling short of breath. She attributes this to lung disease. She has a chronic cough and occasionally bring up greyish sputum. This frequency has not increased recently.  SIGNIFICANT EVENTS  9/3 admit for CP, dyspnea [facility] LHC.   STUDIES:  9/5 stress test 2-3 mm ST depression on  9/6 echo with global LV hypokinesis 9/8 CC  >>     SUBJECTIVE: Feels better with diuresis   VITAL SIGNS: Temp:  [97.6 F (36.4 C)-97.9 F (36.6 C)] 97.6 F (36.4 C) (09/08 0453) Pulse Rate:  [51-69] 51 (09/08 0453) Resp:  [18-20] 18 (09/08 0453) BP: (134-153)/(58-63) 153/61 (09/08 0453) SpO2:  [93 %-99 %] 93 % (09/08 0850) Weight:  [154 lb 1.6 oz (69.9 kg)-156 lb 12 oz (71.1 kg)] 154 lb 1.6 oz (69.9 kg) (09/08 0453)  PHYSICAL EXAMINATION: General:   Chronically ill appearing female in NAD, feels better with diuresis. Neuro:  Alert, oriented, non-focal HEENT: [place]/AT, PERRL Cardiovascular:  RRR, no MRG, trace pre-tibial edema.  Lungs:  Posterior rhonchi, upper airway wheeze resolved vcd noted Abdomen:  Soft, non-tender, non-distended Musculoskeletal:  No acute deformity or ROM limitation Skin:  Grossly intact   Recent Labs Lab 09/09/15 1558 09/10/15 0345  NA 136 137  K 4.8 3.8  CL 102 99*  CO2 22 28  BUN 38* 36*  CREATININE 1.14* 1.05*  GLUCOSE 163* 129*    Recent Labs Lab 09/09/15 1558 09/10/15 0345  HGB 11.1* 10.2*  HCT 35.7* 34.1*  WBC 20.2* 18.0*  PLT 270 265   Dg Chest Port 1 View  Result Date: 09/10/2015 CLINICAL DATA:  Dyspnea EXAM: PORTABLE CHEST 1 VIEW COMPARISON:  09/08/2015 chest radiograph. FINDINGS: Stable cardiomediastinal silhouette with normal heart size and aortic atherosclerosis. No pneumothorax. No pleural effusion. No pulmonary edema. Minimal reticular opacity at the left lung base appears stable, favor scarring or atelectasis. No acute consolidative airspace disease. IMPRESSION: Stable minimal articular left lung base opacities, favor scarring or atelectasis. Otherwise no active disease. Aortic atherosclerosis. Electronically Signed   By: Delbert PhenixJason A Poff M.D.   On: 09/10/2015 08:46    ASSESSMENT / PLAN:  Dyspnea/Hypoxia:  Patricia Hardin has known diagnosis of COPD for which she only takes albuterol scheduled TID. Symptoms on presentation sound infectious in nature and she has been on antibiotics (doxy) since that time for  suspected COPD exacerbation. I suspect that if she was exacerbating her COPD, this is now much improved, if not resolved. Hard to determine how far off of her baseline current hypoxia truly is. Suspect congestive heart failure playing at least somewhat of a role based on climbing BNP at [facility].   Intake/Output Summary (Last 24 hours) at 09/10/15 0920 Last data filed at 09/10/15 0851  Gross  per 24 hour  Intake              200 ml  Output              200 ml  Net                0 ml   Plan:  CXR NAD  prednisone at 40mg  one time dose, more VCD than AECOPD Continue doxycycline for total 5 days Scheduled duonebs, which she will likely need on discharge PRN albuterol nebulized Diuresis per cardiology Cardiac cath per cards. She will need to follow up with pulmonology as an outpatient for PFTs and optimization of her COPD regimen.  PCCM will sign off 9/8  [facility] ACNP Adolph Pollack PCCM Pager [phone] till 3 pm If no answer page [phone] 09/10/2015, 9:18 AM  Attending Note:  Attending Note:  75 year old female with history of COPD, 45 pack year smoker, quit 15 years ago who is not O2 dependent at home but uses albuterol inhaler 3 times per day as instructed.  She is presenting with no increase in sputum production, no fever/chills, grey sputum with no green.  On exam, wheezes disappear with pursed lip breathing.  I reviewed myself from [facility] with hyperinflation but no distinct consolidation.  Discussed with PCCM-NP.  Patient does not have an acute exacerbation of COPD by gold criteria.  VCD is more likely and hypoxemia is more CHF.  COPD:                       - Duonebs.                       - D/C prednisone.                       - Finish course of Doxy with stop date in place after 5 days.                       - PRN albuterol.                       - Need PFT and f/u with PCCM upon discharge  Hypoxemia:                       - Titrate O2 for sat of 88-92%.                       - Will need an ambulatory desaturation study prior to discharge as I anticipate will need home O2.  Pulmonary edema: anticipate this is the biggest culprit for her hypoxemia.                       - Lasix per cards.                       - Treatment of CHF per cardiology.            -  Cath today.  Wheezing: VCD related rather than true wheezing.                       - Taught  patient pursed lip breathing.                       - May benefit from cardiopulmonary rehab upon discharge to teach patient how to breath properly to improve her quality of life.  PCCM will sign off, please call back if needed.  Patient seen and examined, agree with above note.  I dictated the care and orders written for this patient under my direction.  Alyson Reedy, MD [phone]

## 2015-09-10 NOTE — H&P (View-Only) (Signed)
   SUBJECTIVE: Pt with chest pain this am. Nagging pain in center of chest. Dyspnea at baseline.   Tele: sinus  BP (!) 153/61 (BP Location: Left Arm)   Pulse (!) 51   Temp 97.6 F (36.4 C) (Oral)   Resp 18   Ht 5' 1" (1.549 m)   Wt 154 lb 1.6 oz (69.9 kg)   SpO2 97%   BMI 29.12 kg/m   Intake/Output Summary (Last 24 hours) at 09/10/15 0748 Last data filed at 09/09/15 1744  Gross per 24 hour  Intake              200 ml  Output                0 ml  Net              200 ml    PHYSICAL EXAM General: Well developed, well nourished, in no acute distress. Alert and oriented x 3.  Psych:  Good affect, responds appropriately Neck: No JVD. No masses noted.  Lungs: Coarse breath sounds bilaterally.   Heart: RRR with no murmurs noted. Abdomen: Bowel sounds are present. Soft, non-tender.  Extremities: No lower extremity edema.   LABS: Basic Metabolic Panel:  Recent Labs  09/09/15 1558 09/10/15 0345  NA 136 137  K 4.8 3.8  CL 102 99*  CO2 22 28  GLUCOSE 163* 129*  BUN 38* 36*  CREATININE 1.14* 1.05*  CALCIUM 8.8* 8.2*   CBC:  Recent Labs  09/09/15 1558 09/10/15 0345  WBC 20.2* 18.0*  NEUTROABS 18.2*  --   HGB 11.1* 10.2*  HCT 35.7* 34.1*  MCV 84.0 84.2  PLT 270 265   Cardiac Enzymes:  Recent Labs  09/09/15 1558 09/09/15 2108 09/10/15 0345  TROPONINI 0.28* 0.32* 0.32*   Fasting Lipid Panel:  Recent Labs  09/10/15 0345  CHOL 176  HDL 62  LDLCALC 77  TRIG 187*  CHOLHDL 2.8    Current Meds: . cloNIDine  0.3 mg Oral TID  . clopidogrel  75 mg Oral Daily  . doxycycline  100 mg Oral Q12H  . furosemide  40 mg Intravenous BID  . gabapentin  800 mg Oral TID  . ipratropium-albuterol  3 mL Nebulization Q6H  . lisinopril  40 mg Oral Daily  . metoprolol  50 mg Oral BID  . mirtazapine  15 mg Oral QHS  . pantoprazole  40 mg Oral Daily  . predniSONE  40 mg Oral Q breakfast  . sertraline  100 mg Oral Daily  . sodium chloride flush  3 mL Intravenous  Q12H     ASSESSMENT AND PLAN:  1. Acute on Chronic Diastolic CHF/COPD exacerbation: She presented to [facility] 09/05/15 with fever, chills, dyspnea, and worsening chest discomfort. CXR negative for pneumonia. Had been started on Abx and steroids at [facility] for likely COPD exacerbation. We continued this upon arrival here yesterday. Pulmonary consult here recommended continuing duonebs, steroids, doxycycline but felt that her COPD exacerbation was resolving. Her volume status appears to be better today. She diuresed overnight. ? I/O accuracy. Weight down 2 lbs. BNP down to 414 from 1900 at [facility].  -Will hold IV Lasix today.   -continue steroids, duonebs, doxycycline.  - plan cardiac cath today to exclude progression of CAD (see below). - will obtain echocardiogram to assess LV function and wall motion.  -She will need ambulatory O2sat monitoring before d/c and may need home O2   2. Chest pain/Elevated troponin/CAD: Pt with   ongoing chest pain with mostly atypical features in outside hospital but now troponin is up to 0.32. Stress dobutamine on 09/07/2015 from OSH showed positive EKG changes with 2-3mm horizontal ST depression in the inferolateral leads. Echocardiographic results showed evidence of mild global hypokinesis of the left ventricle with dobutamine infusion. She is known to have CAD with DES placed in the RCA in 2005.  -Will plan cardiac cath today. Renal function is ok. Volume status is better.   -continue IV heparin, ASA, Plavix, beta blocker.   3. HTN: BP is better controlled. She was hypertensive up to 253/122 on arrival to [facility]. Will continue Lisinopril 40mg daily, Lopressor 50mg BID and Clonidine 0.3mg TID.    Dario Yono  9/8/20177:48 AM  

## 2015-09-10 NOTE — Progress Notes (Signed)
SUBJECTIVE: Pt with chest pain this am. Nagging pain in center of chest. Dyspnea at baseline.   Tele: sinus  BP (!) 153/61 (BP Location: Left Arm)   Pulse (!) 51   Temp 97.6 F (36.4 C) (Oral)   Resp 18   Ht 5\' 1"  (1.549 m)   Wt 154 lb 1.6 oz (69.9 kg)   SpO2 97%   BMI 29.12 kg/m   Intake/Output Summary (Last 24 hours) at 09/10/15 0748 Last data filed at 09/09/15 1744  Gross per 24 hour  Intake              200 ml  Output                0 ml  Net              200 ml    PHYSICAL EXAM General: Well developed, well nourished, in no acute distress. Alert and oriented x 3.  Psych:  Good affect, responds appropriately Neck: No JVD. No masses noted.  Lungs: Coarse breath sounds bilaterally.   Heart: RRR with no murmurs noted. Abdomen: Bowel sounds are present. Soft, non-tender.  Extremities: No lower extremity edema.   LABS: Basic Metabolic Panel:  Recent Labs  16/10/96 1558 09/10/15 0345  NA 136 137  K 4.8 3.8  CL 102 99*  CO2 22 28  GLUCOSE 163* 129*  BUN 38* 36*  CREATININE 1.14* 1.05*  CALCIUM 8.8* 8.2*   CBC:  Recent Labs  09/09/15 1558 09/10/15 0345  WBC 20.2* 18.0*  NEUTROABS 18.2*  --   HGB 11.1* 10.2*  HCT 35.7* 34.1*  MCV 84.0 84.2  PLT 270 265   Cardiac Enzymes:  Recent Labs  09/09/15 1558 09/09/15 2108 09/10/15 0345  TROPONINI 0.28* 0.32* 0.32*   Fasting Lipid Panel:  Recent Labs  09/10/15 0345  CHOL 176  HDL 62  LDLCALC 77  TRIG 187*  CHOLHDL 2.8    Current Meds: . cloNIDine  0.3 mg Oral TID  . clopidogrel  75 mg Oral Daily  . doxycycline  100 mg Oral Q12H  . furosemide  40 mg Intravenous BID  . gabapentin  800 mg Oral TID  . ipratropium-albuterol  3 mL Nebulization Q6H  . lisinopril  40 mg Oral Daily  . metoprolol  50 mg Oral BID  . mirtazapine  15 mg Oral QHS  . pantoprazole  40 mg Oral Daily  . predniSONE  40 mg Oral Q breakfast  . sertraline  100 mg Oral Daily  . sodium chloride flush  3 mL Intravenous  Q12H     ASSESSMENT AND PLAN:  1. Acute on Chronic Diastolic CHF/COPD exacerbation: She presented to [facility] 09/05/15 with fever, chills, dyspnea, and worsening chest discomfort. CXR negative for pneumonia. Had been started on Abx and steroids at [facility] for likely COPD exacerbation. We continued this upon arrival here yesterday. Pulmonary consult here recommended continuing duonebs, steroids, doxycycline but felt that her COPD exacerbation was resolving. Her volume status appears to be better today. She diuresed overnight. ? I/O accuracy. Weight down 2 lbs. BNP down to 414 from 1900 at [facility].  -Will hold IV Lasix today.   -continue steroids, duonebs, doxycycline.  - plan cardiac cath today to exclude progression of CAD (see below). - will obtain echocardiogram to assess LV function and wall motion.  -She will need ambulatory O2sat monitoring before d/c and may need home O2   2. Chest pain/Elevated troponin/CAD: Pt with  ongoing chest pain with mostly atypical features in outside hospital but now troponin is up to 0.32. Stress dobutamine on 09/07/2015 from OSH showed positive EKG changes with 2-903mm horizontal ST depression in the inferolateral leads. Echocardiographic results showed evidence of mild global hypokinesis of the left ventricle with dobutamine infusion. She is known to have CAD with DES placed in the RCA in 2005.  -Will plan cardiac cath today. Renal function is ok. Volume status is better.   -continue IV heparin, ASA, Plavix, beta blocker.   3. HTN: BP is better controlled. She was hypertensive up to 253/122 on arrival to [facility]. Will continue Lisinopril 40mg  daily, Lopressor 50mg  BID and Clonidine 0.3mg  TID.    Patricia Hardin  9/8/20177:48 AM

## 2015-09-10 NOTE — Progress Notes (Signed)
ANTICOAGULATION CONSULT NOTE - Follow-up Consult  Pharmacy Consult for Heparin Indication: chest pain/ACS  Allergies  Allergen Reactions  . Penicillins Hives and Swelling    Patient Measurements: Height: 5\' 1"  (154.9 cm) Weight: 154 lb 1.6 oz (69.9 kg) IBW/kg (Calculated) : 47.8 Heparin Dosing Weight: 63.2kg  Vital Signs: Temp: 97.6 F (36.4 C) (09/08 0453) Temp Source: Oral (09/08 0453) BP: 153/61 (09/08 0453) Pulse Rate: 51 (09/08 0453)  Labs:  Recent Labs  09/09/15 1558 09/09/15 2108 09/10/15 0345  HGB 11.1*  --  10.2*  HCT 35.7*  --  34.1*  PLT 270  --  265  APTT  --  88*  --   LABPROT  --  14.0  --   INR  --  1.08  --   HEPARINUNFRC  --   --  0.34  CREATININE 1.14*  --   --   TROPONINI 0.28* 0.32*  --     Estimated Creatinine Clearance: 38.7 mL/min (by C-G formula based on SCr of 1.14 mg/dL).  Assessment: 89 YOF with COPD exacerbation [facility] cardiac work-up. Pt on heparin for r/o ACS. Heparin level therapeutic on 750 units/hr. Hgb down a bit, plt ok. No bleeding noted.  Goal of Therapy:  Heparin level 0.3-0.7 units/ml Monitor platelets by anticoagulation protocol: Yes   Plan:  Continue heparin at 750 units/hr F/u 6hr confirmatory heparin level  Christoper Fabianaron Kristain Hu, PharmD, BCPS Clinical pharmacist, pager [phone] 5:15 AM, 09/10/2015

## 2015-09-10 NOTE — Interval H&P Note (Signed)
History and Physical Interval Note:  09/10/2015 2:42 PM  Patricia Hardin  has presented today for surgery, with the diagnosis of cp  The various methods of treatment have been discussed with the patient and family. After consideration of risks, benefits and other options for treatment, the patient has consented to  Procedure(s): Left Heart Cath and Coronary Angiography (N/A) as a surgical intervention .  The patient's history has been reviewed, patient examined, no change in status, stable for surgery.  I have reviewed the patient's chart and labs.  Questions were answered to the patient's satisfaction.    Cath Lab Visit (complete for each Cath Lab visit)  Clinical Evaluation Leading to the Procedure:   ACS: Yes.    Non-ACS:    Anginal Classification: CCS IV  Anti-ischemic medical therapy: Minimal Therapy (1 class of medications)  Non-Invasive Test Results: High-risk stress test findings: cardiac mortality >3%/year  Prior CABG: No previous CABG       Theron Aristaeter [facility] CentersJordanMD,FACC 09/10/2015 2:42 PM

## 2015-09-11 ENCOUNTER — Inpatient Hospital Stay (HOSPITAL_COMMUNITY): Payer: Medicare HMO

## 2015-09-11 DIAGNOSIS — R0789 Other chest pain: Secondary | ICD-10-CM

## 2015-09-11 DIAGNOSIS — I5033 Acute on chronic diastolic (congestive) heart failure: Secondary | ICD-10-CM

## 2015-09-11 DIAGNOSIS — I509 Heart failure, unspecified: Secondary | ICD-10-CM

## 2015-09-11 LAB — ECHOCARDIOGRAM COMPLETE
CHL CUP MV DEC (S): 239
E decel time: 239 msec
E/e' ratio: 15.47
FS: 35 % (ref 28–44)
Height: 61 in
IV/PV OW: 0.94
LA diam end sys: 37 mm
LA vol A4C: 38.8 ml
LA vol: 36.6 mL
LADIAMINDEX: 2.2 cm/m2
LASIZE: 37 mm
LAVOLIN: 21.8 mL/m2
LV E/e' medial: 15.47
LV TDI E'LATERAL: 6.85
LV TDI E'MEDIAL: 6.2
LV e' LATERAL: 6.85 cm/s
LVEEAVG: 15.47
LVOT area: 2.84 cm2
LVOTD: 19 mm
Lateral S' vel: 14 cm/s
MV Peak grad: 4 mmHg
MV pk A vel: 108 m/s
MV pk E vel: 106 m/s
PW: 11.5 mm — AB (ref 0.6–1.1)
TAPSE: 21.7 mm
Weight: 2440 oz

## 2015-09-11 LAB — BASIC METABOLIC PANEL
Anion gap: 12 (ref 5–15)
BUN: 44 mg/dL — AB (ref 6–20)
CHLORIDE: 102 mmol/L (ref 101–111)
CO2: 27 mmol/L (ref 22–32)
CREATININE: 1.24 mg/dL — AB (ref 0.44–1.00)
Calcium: 8 mg/dL — ABNORMAL LOW (ref 8.9–10.3)
GFR calc Af Amer: 48 mL/min — ABNORMAL LOW (ref >60)
GFR calc non Af Amer: 42 mL/min — ABNORMAL LOW (ref >60)
Glucose, Bld: 87 mg/dL (ref 65–99)
Potassium: 4.2 mmol/L (ref 3.5–5.1)
Sodium: 141 mmol/L (ref 135–145)

## 2015-09-11 LAB — CBC
HCT: 34.6 % — ABNORMAL LOW (ref 36.0–46.0)
Hemoglobin: 10.3 g/dL — ABNORMAL LOW (ref 12.0–15.0)
MCH: 25.4 pg — AB (ref 26.0–34.0)
MCHC: 29.8 g/dL — AB (ref 30.0–36.0)
MCV: 85.2 fL (ref 78.0–100.0)
PLATELETS: 262 10*3/uL (ref 150–400)
RBC: 4.06 MIL/uL (ref 3.87–5.11)
RDW: 14.7 % (ref 11.5–15.5)
WBC: 14.8 10*3/uL — ABNORMAL HIGH (ref 4.0–10.5)

## 2015-09-11 MED ORDER — METOPROLOL TARTRATE 100 MG PO TABS: 50.0000 mg | ORAL_TABLET | Freq: Two times a day (BID) | ORAL | 6 refills | Status: AC

## 2015-09-11 MED ORDER — IPRATROPIUM-ALBUTEROL 0.5-2.5 (3) MG/3ML IN SOLN
3.0000 mL | Freq: Three times a day (TID) | RESPIRATORY_TRACT | Status: DC
  Administered 2015-09-11: 3 mL via RESPIRATORY_TRACT
  Filled 2015-09-11 (×2): qty 3

## 2015-09-11 NOTE — Progress Notes (Signed)
Patient ID: Patricia DikeHelen Ruth Hardin, female   DOB: 09-19-1940, 75 y.o.   MRN: [id number]    Patient Name: Patricia DikeHelen Ruth Hardin Date of Encounter: 09/11/2015     Active Problems:   Atypical chest pain   Acute on chronic diastolic (congestive) heart failure (HCC)   COPD exacerbation (HCC)   Acute exacerbation of CHF (congestive heart failure) (HCC)   Dyspnea    SUBJECTIVE  Minimal residual chest pain. No sob.   CURRENT MEDS . cloNIDine  0.3 mg Oral TID  . clopidogrel  75 mg Oral Daily  . furosemide  40 mg Intravenous BID  . gabapentin  800 mg Oral TID  . heparin  5,000 Units Subcutaneous Q8H  . ipratropium-albuterol  3 mL Nebulization TID  . lisinopril  40 mg Oral Daily  . metoprolol  50 mg Oral BID  . mirtazapine  15 mg Oral QHS  . pantoprazole  40 mg Oral Daily  . sertraline  100 mg Oral Daily  . sodium chloride flush  3 mL Intravenous Q12H  . sodium chloride flush  3 mL Intravenous Q12H    OBJECTIVE  Vitals:   09/11/15 0319 09/11/15 0728 09/11/15 0738 09/11/15 1000  BP: (!) 107/45 (!) 127/55  (!) 107/39  Pulse: (!) 56 (!) 56  62  Resp: 17     Temp: 97.5 F (36.4 C)     TempSrc: Axillary     SpO2: 97%  97%   Weight: 152 lb 8 oz (69.2 kg)     Height:        Intake/Output Summary (Last 24 hours) at 09/11/15 1055 Last data filed at 09/11/15 0818  Gross per 24 hour  Intake              240 ml  Output              950 ml  Net             -710 ml   Filed Weights   09/09/15 1700 09/10/15 0453 09/11/15 0319  Weight: 156 lb 12 oz (71.1 kg) 154 lb 1.6 oz (69.9 kg) 152 lb 8 oz (69.2 kg)    PHYSICAL EXAM  General: Pleasant, but a little sleepy, NAD. Neuro: Alert and oriented X 3. Moves all extremities spontaneously. Psych: blunted affect. HEENT:  Normal  Neck: Supple without bruits or JVD. Lungs:  Resp regular and unlabored, CTA. Heart: RRR no s3, s4, or murmurs. Abdomen: Soft, non-tender, non-distended, BS + x 4.  Extremities: No clubbing, cyanosis or edema.  DP/PT/Radials 2+ and equal bilaterally.  Accessory Clinical Findings  CBC  Recent Labs  09/09/15 1558 09/10/15 0345 09/11/15 0241  WBC 20.2* 18.0* 14.8*  NEUTROABS 18.2*  --   --   HGB 11.1* 10.2* 10.3*  HCT 35.7* 34.1* 34.6*  MCV 84.0 84.2 85.2  PLT 270 265 262   Basic Metabolic Panel  Recent Labs  09/10/15 0345 09/11/15 0241  NA 137 141  K 3.8 4.2  CL 99* 102  CO2 28 27  GLUCOSE 129* 87  BUN 36* 44*  CREATININE 1.05* 1.24*  CALCIUM 8.2* 8.0*   Liver Function Tests No results for input(s): AST, ALT, ALKPHOS, BILITOT, PROT, ALBUMIN in the last 72 hours. No results for input(s): LIPASE, AMYLASE in the last 72 hours. Cardiac Enzymes  Recent Labs  09/09/15 1558 09/09/15 2108 09/10/15 0345  TROPONINI 0.28* 0.32* 0.32*   BNP Invalid input(s): POCBNP D-Dimer No results for input(s): DDIMER in the last 72  hours. Hemoglobin A1C No results for input(s): HGBA1C in the last 72 hours. Fasting Lipid Panel  Recent Labs  09/10/15 0345  CHOL 176  HDL 62  LDLCALC 77  TRIG 187*  CHOLHDL 2.8   Thyroid Function Tests No results for input(s): TSH, T4TOTAL, T3FREE, THYROIDAB in the last 72 hours.  Invalid input(s): FREET3  TELE  nsr  Radiology/Studies  Dg Chest Port 1 View  Result Date: 09/10/2015 CLINICAL DATA:  Dyspnea EXAM: PORTABLE CHEST 1 VIEW COMPARISON:  09/08/2015 chest radiograph. FINDINGS: Stable cardiomediastinal silhouette with normal heart size and aortic atherosclerosis. No pneumothorax. No pleural effusion. No pulmonary edema. Minimal reticular opacity at the left lung base appears stable, favor scarring or atelectasis. No acute consolidative airspace disease. IMPRESSION: Stable minimal articular left lung base opacities, favor scarring or atelectasis. Otherwise no active disease. Aortic atherosclerosis. Electronically Signed   By: Delbert Phenix M.D.   On: 09/10/2015 08:46    ASSESSMENT AND PLAN  1. CAD - left heart cath demonstrated no  progression of Her disease. She is stable for discharge 2. Hypertension - her blood pressure was very high on admission. It is now controlled. She will be discharged on her current in-hospital medications. 3. COPD - she is not coughing and has no shortness of breath on exam. Chest x-ray from yesterday demonstrates no evidence of pneumonia. 4. Disposition - the patient is stable for discharge. She will follow-up in [place].  Gregg Taylor,M.D.  09/11/2015 10:55 AM

## 2015-09-11 NOTE — Discharge Summary (Signed)
Discharge Summary    Patient ID: Patricia DikeHelen Ruth Beste,  MRN: [id number], DOB/AGE: [birth year] 75 y.o.  Admit date: 09/09/2015 Discharge date: 09/11/2015  Primary Care Provider: ZIMMER,WILLIAM Primary Cardiologist: Dr. Margret ChancePainter - [place], [place]  Discharge Diagnoses    Active Problems:   Atypical chest pain   Acute on chronic diastolic (congestive) heart failure (HCC)   COPD exacerbation (HCC)   Acute exacerbation of CHF (congestive heart failure) (HCC)   Dyspnea   History of Present Illness     Patricia Hardin is a 75 y.o. female with past medical history of CAD (s/p DES to RCA in 2005), COPD, HTN, and CVA who presented to [facility] on 09/05/2015 for evaluation of chest pain and was transferred to [facility] on 09/09/2015 for further evaluation.  She initially reported having chest pain on an almost daily basis for years. On the day of her initial presentation she reported having fevers, chills, worsening dyspnea, and pain along her left pectoral region.   She had been given SL NTG while at [facility] with no improvement in her symptoms. Her chest is very tender to palpation along her entire precordium.   In reviewing records from [facility], her BP upon arrival on 09/05/2015 was 253/122 and she was started on Labetaolol. Initial labs showed a negative d-dimer, negative troponin, BNP 1600, and WBC of [id number]. CXR showed no active cardiopulmonary disease.   Repeat labs on 9/7 show a WBC of 19.6, Hgb 10.1, and platelets 294. A repeat troponin was 0.03 and BNP was 4656. It appears she was given one dose of Solumedrol on admission and started on Prednisone 40mg  daily. Received 1 dose of IV Lasix 20mg  on 9/6. Has been receiving scheduled Duonebs Q4H and Doxycycline 100mg  BID.   A stress dobutamine was obtained on 09/07/2015 showed positive EKG changes with 2-223mm horizontal ST depression in the inferolateral leads. Echocardiographic results showed evidence of mild global hypokinesis of the  left ventricle. Cardiac catheterization was recommended for definitive evaluation.    Hospital Course     Consultants: Pulmonology   Upon arrival to [facility], she appeared volume overloaded and was started on IV Lasix. Pulmonology was consulted for assistance in managing her COPD. She was started on scheduled Duonebs and instructed to continue Prednisone and Doxycycline for 5 days total (had been started on 09/05/2015).   On 09/10/2015, her respiratory status had improved. Cyclic troponin values peaked at 0.32. A cardiac catheterization was recommended for definitive evaluation in the setting of her symptoms and abnormal stress test. Her cath showed nonobstructive CAD with 50% 1st Diag disease and 50% Mid-distal Cx disease. Normal LV function was noted. Continued medical management was recommended. No complications were noted during the procedure.   The following morning, she reported minimal residual chest discomfort. Respiratory status was back to baseline. Echocardiogram showed preserved EF of 60-65% with Grade 1 DD.   She was last examined by Dr. Ladona Ridgelaylor and deemed stable for discharge. Her IV Lasix was switched back to PTA dosing of PO Lasix 40mg  daily. Other discharge medications are listed below. She was instructed to follow-up with [place] Primary Cardi[place] in [place] within the next two weeks. She was discharged home in good condition.   _____________  Discharge Vitals Blood pressure (!) 101/44, pulse 60, temperature 97.7 F (36.5 C), temperature source Oral, resp. rate 17, height 5\' 1"  (1.549 m), weight 152 lb 8 oz (69.2 kg), SpO2 95 %.  Filed Weights   09/09/15 1700 09/10/15 0453  09/11/15 0319  Weight: 156 lb 12 oz (71.1 kg) 154 lb 1.6 oz (69.9 kg) 152 lb 8 oz (69.2 kg)    Labs & Radiologic Studies     CBC  Recent Labs  09/09/15 1558 09/10/15 0345 09/11/15 0241  WBC 20.2* 18.0* 14.8*  NEUTROABS 18.2*  --   --   HGB 11.1* 10.2* 10.3*  HCT 35.7* 34.1* 34.6*  MCV  84.0 84.2 85.2  PLT 270 265 262   Basic Metabolic Panel  Recent Labs  09/10/15 0345 09/11/15 0241  NA 137 141  K 3.8 4.2  CL 99* 102  CO2 28 27  GLUCOSE 129* 87  BUN 36* 44*  CREATININE 1.05* 1.24*  CALCIUM 8.2* 8.0*   Liver Function Tests No results for input(s): AST, ALT, ALKPHOS, BILITOT, PROT, ALBUMIN in the last 72 hours. No results for input(s): LIPASE, AMYLASE in the last 72 hours. Cardiac Enzymes  Recent Labs  09/09/15 1558 09/09/15 2108 09/10/15 0345  TROPONINI 0.28* 0.32* 0.32*   BNP Invalid input(s): POCBNP D-Dimer No results for input(s): DDIMER in the last 72 hours. Hemoglobin A1C No results for input(s): HGBA1C in the last 72 hours. Fasting Lipid Panel  Recent Labs  09/10/15 0345  CHOL 176  HDL 62  LDLCALC 77  TRIG 187*  CHOLHDL 2.8   Dg Chest Port 1 View  Result Date: 09/10/2015 CLINICAL DATA:  Dyspnea EXAM: PORTABLE CHEST 1 VIEW COMPARISON:  09/08/2015 chest radiograph. FINDINGS: Stable cardiomediastinal silhouette with normal heart size and aortic atherosclerosis. No pneumothorax. No pleural effusion. No pulmonary edema. Minimal reticular opacity at the left lung base appears stable, favor scarring or atelectasis. No acute consolidative airspace disease. IMPRESSION: Stable minimal articular left lung base opacities, favor scarring or atelectasis. Otherwise no active disease. Aortic atherosclerosis. Electronically Signed   By: Delbert Phenix M.D.   On: 09/10/2015 08:46    Diagnostic Studies/Procedures     Cardiac Catheterization: 09/10/2015  Ost 1st Diag to 1st Diag lesion, 50 %stenosed.  Prox LAD to Dist LAD lesion, 10 %stenosed.  Mid Cx to Dist Cx lesion, 50 %stenosed.  Prox RCA to Mid RCA lesion, 0 %stenosed.  The left ventricular systolic function is normal.  LV end diastolic pressure is normal.  The left ventricular ejection fraction is 55-65% by visual estimate.   1. Nonobstructive CAD 2. Normal LV function 3. Mildly elevated  LVEDP  Plan: medical management  Echocardiogram:  Study Conclusions  - Left ventricle: The cavity size was normal. Wall thickness was   increased in a pattern of mild LVH. Systolic function was normal.   The estimated ejection fraction was in the range of 60% to 65%.   Wall motion was normal; there were no regional wall motion   abnormalities. Doppler parameters are consistent with abnormal   left ventricular relaxation (grade 1 diastolic dysfunction).   Doppler parameters are consistent with high ventricular filling   pressure. - Mitral valve: Moderately calcified annulus.    Disposition   Pt is being discharged home today in good condition.  Follow-up Plans & Appointments    Follow-up Information    Follow-up with your Cardiologist,Dr. Painter,within the next 2 weeks. Marland Kitchen          Discharge Instructions    Diet - low sodium heart healthy    Complete by:  As directed   Discharge instructions    Complete by:  As directed   PLEASE REMEMBER TO BRING ALL OF YOUR MEDICATIONS TO EACH OF YOUR FOLLOW-UP OFFICE  VISITS.  PLEASE ATTEND ALL SCHEDULED FOLLOW-UP APPOINTMENTS.   Activity: Increase activity slowly as tolerated. You may shower, but no soaking baths (or swimming) for 1 week. No driving for 24 hours. No lifting over 5 lbs for 1 week. No sexual activity for 1 week.   You May Return to Work: in 1 week (if applicable)  Wound Care: You may wash cath site gently with soap and water. Keep cath site clean and dry. If you notice pain, swelling, bleeding or pus at your cath site, please call [phone].   Increase activity slowly    Complete by:  As directed      Discharge Medications     Medication List    STOP taking these medications   diltiazem 90 MG tablet Commonly known as:  CARDIZEM     TAKE these medications   albuterol 108 (90 Base) MCG/ACT inhaler Commonly known as:  PROVENTIL HFA;VENTOLIN HFA Inhale 1 puff into the lungs every 4 (four) hours as needed  for wheezing or shortness of breath.   aspirin 81 MG chewable tablet Chew 81 mg by mouth daily as needed for mild pain.   cloNIDine 0.3 MG tablet Commonly known as:  CATAPRES Take 0.3 mg by mouth 3 (three) times daily.   clopidogrel 75 MG tablet Commonly known as:  PLAVIX Take 75 mg by mouth daily.   furosemide 40 MG tablet Commonly known as:  LASIX Take 40 mg by mouth daily.   gabapentin 800 MG tablet Commonly known as:  NEURONTIN Take 800 mg by mouth 3 (three) times daily.   IBUPROFEN PM 200-38 MG Tabs Generic drug:  Ibuprofen-Diphenhydramine Cit Take 1 tablet by mouth at bedtime.   lisinopril 40 MG tablet Commonly known as:  PRINIVIL,ZESTRIL Take 40 mg by mouth daily.   metoprolol 100 MG tablet Commonly known as:  LOPRESSOR Take 0.5 tablets (50 mg total) by mouth 2 (two) times daily. What changed:  how much to take   mirtazapine 15 MG tablet Commonly known as:  REMERON Take 15 mg by mouth at bedtime.   omeprazole 20 MG capsule Commonly known as:  PRILOSEC Take 20 mg by mouth daily.   sertraline 100 MG tablet Commonly known as:  ZOLOFT Take 100 mg by mouth daily.       Aspirin prescribed at discharge?  Yes High Intensity Statin Prescribed? (Lipitor 40-80mg  or Crestor 20-40mg ): No: At outpatient follow-up with Primary Cardiologist Beta Blocker Prescribed? Yes For EF 40% or less, Was ACEI/ARB Prescribed? No: N/A ADP Receptor Inhibitor Prescribed? (i.e. Plavix etc.-Includes Medically Managed Patients): Yes For EF <40%, Aldosterone Inhibitor Prescribed? No: N/A Was EF assessed during THIS hospitalization? Yes Was Cardiac Rehab II ordered? (Included Medically managed Patients): No: Not ACS   Allergies Allergies  Allergen Reactions  . Penicillins Hives and Swelling     Outstanding Labs/Studies   PFT's as outpatient.  Duration of Discharge Encounter   Greater than 30 minutes including physician time.  Signed, Patricia Lennox, PA-C 09/11/2015,  10:04 PM  Agree with above.  Leonia Reeves.D.

## 2015-09-11 NOTE — Progress Notes (Signed)
  Echocardiogram 2D Echocardiogram has been performed.  Delcie RochENNINGTON, Giamarie Bueche 09/11/2015, 9:52 AM

## 2015-09-11 NOTE — Progress Notes (Signed)
Pt has been discharged home with family. IV was removed with no complications. Telemetry box was removed. Pt received discharge instructions and all questions were answered. Pt was instructed to take her BP daily prior to taking medications. Pt stated that she has been doing that. Pt left the unit via wheelchair and was accompanied by this RN and pt's husband. Pt left with all belongings. Pt was in no distress at time of discharge.   Berdine DanceLauren Moffitt BSN, RN

## 2015-09-13 ENCOUNTER — Encounter (HOSPITAL_COMMUNITY): Payer: Self-pay | Admitting: Cardiology

## 2017-12-07 IMAGING — CR DG CHEST 1V PORT
1 series · 1 of 1 positions shown · non-contrast
Comparison: 09/08/2015 chest radiograph.

CLINICAL DATA: Dyspnea

EXAM:
PORTABLE CHEST 1 VIEW

[AP]
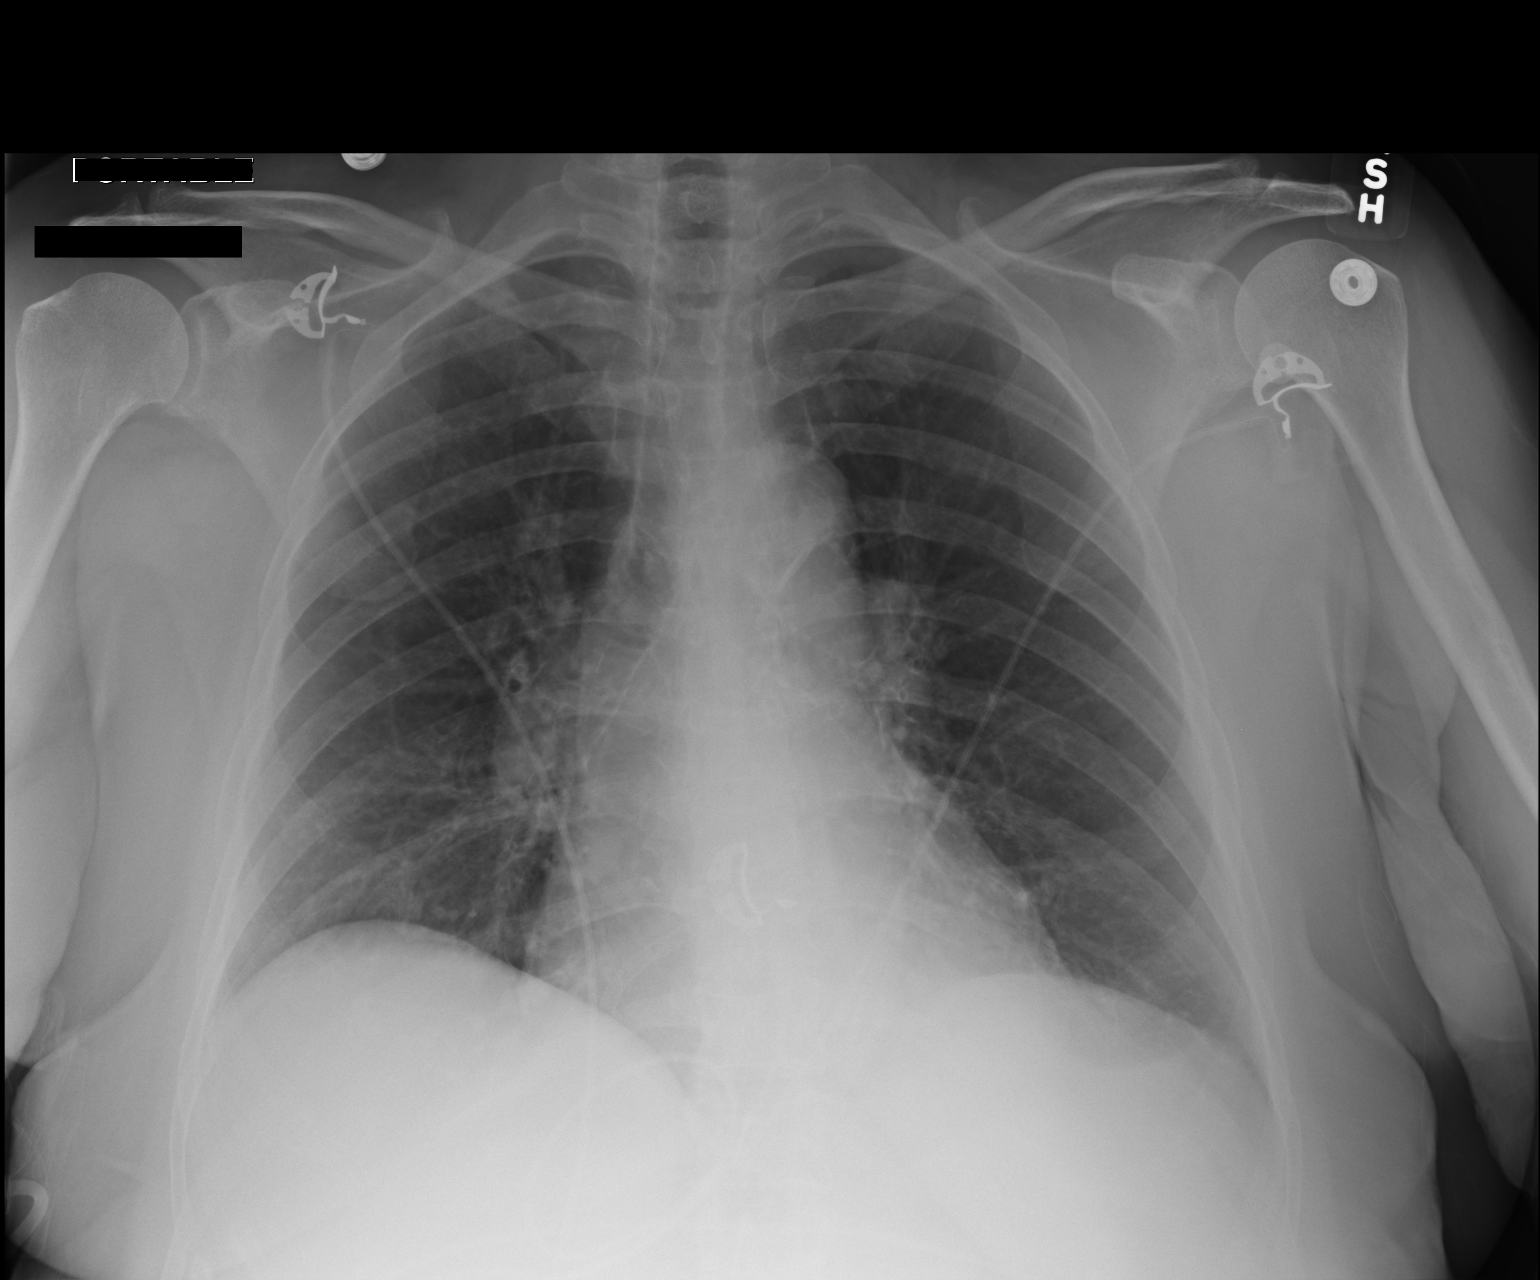

[1 of 1 positions shown; findings below may reference images not displayed]

FINDINGS: Stable cardiomediastinal silhouette with normal heart size and
aortic atherosclerosis. No pneumothorax. No pleural effusion. No
pulmonary edema. Minimal reticular opacity at the left lung base
appears stable, favor scarring or atelectasis. No acute
consolidative airspace disease.
IMPRESSION: Stable minimal articular left lung base opacities, favor scarring or
atelectasis. Otherwise no active disease.

Aortic atherosclerosis.

## 2021-09-02 DEATH — deceased
